# Patient Record
Sex: Male | Born: 1970 | Race: White | Hispanic: No | Marital: Married | Smoking: Never smoker
Health system: Southern US, Community
[De-identification: ages and names within clinical notes are randomized; demographics above are authoritative.]

## PROBLEM LIST (undated history)

## (undated) DIAGNOSIS — J189 Pneumonia, unspecified organism: Secondary | ICD-10-CM

## (undated) DIAGNOSIS — K219 Gastro-esophageal reflux disease without esophagitis: Secondary | ICD-10-CM

## (undated) DIAGNOSIS — I4891 Unspecified atrial fibrillation: Secondary | ICD-10-CM

## (undated) DIAGNOSIS — R7303 Prediabetes: Secondary | ICD-10-CM

## (undated) DIAGNOSIS — M1990 Unspecified osteoarthritis, unspecified site: Secondary | ICD-10-CM

## (undated) DIAGNOSIS — Z973 Presence of spectacles and contact lenses: Secondary | ICD-10-CM

## (undated) DIAGNOSIS — C801 Malignant (primary) neoplasm, unspecified: Secondary | ICD-10-CM

## (undated) DIAGNOSIS — I10 Essential (primary) hypertension: Secondary | ICD-10-CM

## (undated) HISTORY — PX: ESOPHAGOGASTRODUODENOSCOPY (EGD) WITH ESOPHAGEAL DILATION: SHX5812

## (undated) HISTORY — PX: KNEE ARTHROSCOPY: SUR90

## (undated) HISTORY — PX: VASECTOMY: SHX75

---

## 2012-05-15 ENCOUNTER — Emergency Department: Payer: Self-pay | Admitting: Emergency Medicine

## 2017-03-11 ENCOUNTER — Other Ambulatory Visit: Payer: Self-pay | Admitting: Sports Medicine

## 2017-03-11 DIAGNOSIS — M25541 Pain in joints of right hand: Secondary | ICD-10-CM

## 2017-03-11 DIAGNOSIS — M67441 Ganglion, right hand: Secondary | ICD-10-CM

## 2017-03-11 DIAGNOSIS — M25841 Other specified joint disorders, right hand: Secondary | ICD-10-CM

## 2017-03-12 ENCOUNTER — Other Ambulatory Visit: Payer: Self-pay | Admitting: Sports Medicine

## 2017-03-18 ENCOUNTER — Ambulatory Visit
Admission: RE | Admit: 2017-03-18 | Discharge: 2017-03-18 | Disposition: A | Discharge status: Home / Self Care | Payer: 59 | Source: Ambulatory Visit | Attending: Sports Medicine | Admitting: Sports Medicine

## 2017-03-18 DIAGNOSIS — M25841 Other specified joint disorders, right hand: Secondary | ICD-10-CM

## 2017-03-18 DIAGNOSIS — M67441 Ganglion, right hand: Secondary | ICD-10-CM | POA: Diagnosis not present

## 2017-03-18 DIAGNOSIS — M67843 Other specified disorders of tendon, right hand: Secondary | ICD-10-CM | POA: Insufficient documentation

## 2017-03-18 DIAGNOSIS — M25541 Pain in joints of right hand: Secondary | ICD-10-CM | POA: Diagnosis not present

## 2017-03-18 MED ORDER — GADOBENATE DIMEGLUMINE 529 MG/ML IV SOLN
20.0000 mL | Freq: Once | INTRAVENOUS | Status: AC | PRN
  Administered 2017-03-18: 20 mL via INTRAVENOUS

## 2017-04-21 ENCOUNTER — Encounter: Payer: Self-pay | Admitting: *Deleted

## 2017-04-21 ENCOUNTER — Other Ambulatory Visit: Payer: Self-pay

## 2017-04-28 ENCOUNTER — Ambulatory Visit: Payer: 59 | Admitting: Anesthesiology

## 2017-04-28 ENCOUNTER — Encounter
Admission: RE | Disposition: A | Discharge status: Home / Self Care | Payer: Self-pay | Source: Ambulatory Visit | Attending: Surgery

## 2017-04-28 ENCOUNTER — Ambulatory Visit
Admission: RE | Admit: 2017-04-28 | Discharge: 2017-04-28 | Disposition: A | Discharge status: Home / Self Care | Payer: 59 | Source: Ambulatory Visit | Attending: Surgery | Admitting: Surgery

## 2017-04-28 DIAGNOSIS — M1990 Unspecified osteoarthritis, unspecified site: Secondary | ICD-10-CM | POA: Diagnosis not present

## 2017-04-28 DIAGNOSIS — D481 Neoplasm of uncertain behavior of connective and other soft tissue: Secondary | ICD-10-CM | POA: Insufficient documentation

## 2017-04-28 DIAGNOSIS — E669 Obesity, unspecified: Secondary | ICD-10-CM | POA: Diagnosis not present

## 2017-04-28 DIAGNOSIS — Z6831 Body mass index (BMI) 31.0-31.9, adult: Secondary | ICD-10-CM | POA: Insufficient documentation

## 2017-04-28 DIAGNOSIS — I10 Essential (primary) hypertension: Secondary | ICD-10-CM | POA: Insufficient documentation

## 2017-04-28 DIAGNOSIS — R0683 Snoring: Secondary | ICD-10-CM | POA: Diagnosis not present

## 2017-04-28 DIAGNOSIS — K219 Gastro-esophageal reflux disease without esophagitis: Secondary | ICD-10-CM | POA: Insufficient documentation

## 2017-04-28 HISTORY — DX: Essential (primary) hypertension: I10

## 2017-04-28 HISTORY — DX: Gastro-esophageal reflux disease without esophagitis: K21.9

## 2017-04-28 HISTORY — DX: Presence of spectacles and contact lenses: Z97.3

## 2017-04-28 HISTORY — PX: MASS EXCISION: SHX2000

## 2017-04-28 HISTORY — DX: Unspecified osteoarthritis, unspecified site: M19.90

## 2017-04-28 SURGERY — EXCISION MASS
Anesthesia: General | Laterality: Right | Wound class: Clean

## 2017-04-28 MED ORDER — GLYCOPYRROLATE 0.2 MG/ML IJ SOLN
INTRAMUSCULAR | Status: DC | PRN
  Administered 2017-04-28: 0.1 mg via INTRAVENOUS

## 2017-04-28 MED ORDER — LACTATED RINGERS IV SOLN
INTRAVENOUS | Status: DC
  Administered 2017-04-28: 12:00:00 via INTRAVENOUS

## 2017-04-28 MED ORDER — FENTANYL CITRATE (PF) 100 MCG/2ML IJ SOLN: 25.0000 ug | INTRAMUSCULAR | Status: DC | PRN

## 2017-04-28 MED ORDER — ACETAMINOPHEN 10 MG/ML IV SOLN: 1000.0000 mg | Freq: Once | INTRAVENOUS | Status: DC | PRN

## 2017-04-28 MED ORDER — DEXAMETHASONE SODIUM PHOSPHATE 4 MG/ML IJ SOLN
INTRAMUSCULAR | Status: DC | PRN
  Administered 2017-04-28: 4 mg via INTRAVENOUS

## 2017-04-28 MED ORDER — ONDANSETRON HCL 4 MG/2ML IJ SOLN
INTRAMUSCULAR | Status: DC | PRN
  Administered 2017-04-28: 4 mg via INTRAVENOUS

## 2017-04-28 MED ORDER — OXYCODONE HCL 5 MG PO TABS: 5.0000 mg | ORAL_TABLET | Freq: Once | ORAL | Status: DC | PRN

## 2017-04-28 MED ORDER — ONDANSETRON HCL 4 MG PO TABS: 4.0000 mg | ORAL_TABLET | Freq: Four times a day (QID) | ORAL | Status: DC | PRN

## 2017-04-28 MED ORDER — DEXTROSE 5 % IV SOLN
2000.0000 mg | Freq: Once | INTRAVENOUS | Status: AC
  Administered 2017-04-28: 2000 mg via INTRAVENOUS

## 2017-04-28 MED ORDER — HYDROCODONE-ACETAMINOPHEN 5-325 MG PO TABS: 1.0000 | ORAL_TABLET | ORAL | Status: DC | PRN

## 2017-04-28 MED ORDER — HYDROCODONE-ACETAMINOPHEN 5-325 MG PO TABS: 1.0000 | ORAL_TABLET | Freq: Four times a day (QID) | ORAL | 0 refills | Status: DC | PRN

## 2017-04-28 MED ORDER — ONDANSETRON HCL 4 MG/2ML IJ SOLN: 4.0000 mg | Freq: Once | INTRAMUSCULAR | Status: DC | PRN

## 2017-04-28 MED ORDER — PROPOFOL 10 MG/ML IV BOLUS
INTRAVENOUS | Status: DC | PRN
  Administered 2017-04-28: 150 mg via INTRAVENOUS

## 2017-04-28 MED ORDER — ONDANSETRON HCL 4 MG/2ML IJ SOLN: 4.0000 mg | Freq: Four times a day (QID) | INTRAMUSCULAR | Status: DC | PRN

## 2017-04-28 MED ORDER — BUPIVACAINE HCL (PF) 0.5 % IJ SOLN
INTRAMUSCULAR | Status: DC | PRN
  Administered 2017-04-28: 10 mL via INTRA_ARTICULAR

## 2017-04-28 MED ORDER — FENTANYL CITRATE (PF) 100 MCG/2ML IJ SOLN
INTRAMUSCULAR | Status: DC | PRN
  Administered 2017-04-28: 50 ug via INTRAVENOUS

## 2017-04-28 MED ORDER — LIDOCAINE HCL (CARDIAC) 20 MG/ML IV SOLN
INTRAVENOUS | Status: DC | PRN
  Administered 2017-04-28: 40 mg via INTRATRACHEAL

## 2017-04-28 MED ORDER — OXYCODONE HCL 5 MG/5ML PO SOLN: 5.0000 mg | Freq: Once | ORAL | Status: DC | PRN

## 2017-04-28 MED ORDER — METOCLOPRAMIDE HCL 5 MG PO TABS: 5.0000 mg | ORAL_TABLET | Freq: Three times a day (TID) | ORAL | Status: DC | PRN

## 2017-04-28 MED ORDER — ACETAMINOPHEN 325 MG PO TABS: 650.0000 mg | ORAL_TABLET | Freq: Once | ORAL | Status: DC | PRN

## 2017-04-28 MED ORDER — METOCLOPRAMIDE HCL 5 MG/ML IJ SOLN: 5.0000 mg | Freq: Three times a day (TID) | INTRAMUSCULAR | Status: DC | PRN

## 2017-04-28 MED ORDER — LACTATED RINGERS IV SOLN: INTRAVENOUS | Status: DC

## 2017-04-28 MED ORDER — POTASSIUM CHLORIDE IN NACL 20-0.9 MEQ/L-% IV SOLN: INTRAVENOUS | Status: DC

## 2017-04-28 MED ORDER — ACETAMINOPHEN 160 MG/5ML PO SOLN: 325.0000 mg | ORAL | Status: DC | PRN

## 2017-04-28 MED ORDER — MIDAZOLAM HCL 5 MG/5ML IJ SOLN
INTRAMUSCULAR | Status: DC | PRN
  Administered 2017-04-28: 2 mg via INTRAVENOUS

## 2017-04-28 SURGICAL SUPPLY — 26 items
BANDAGE ELASTIC 2 LF NS (GAUZE/BANDAGES/DRESSINGS) ×3 IMPLANT
BNDG COHESIVE 4X5 TAN STRL (GAUZE/BANDAGES/DRESSINGS) ×3 IMPLANT
BNDG ESMARK 4X12 TAN STRL LF (GAUZE/BANDAGES/DRESSINGS) ×3 IMPLANT
CANISTER SUCT 1200ML W/VALVE (MISCELLANEOUS) ×3 IMPLANT
CHLORAPREP W/TINT 26ML (MISCELLANEOUS) ×3 IMPLANT
CORD BIP STRL DISP 12FT (MISCELLANEOUS) ×3 IMPLANT
COVER LIGHT HANDLE UNIVERSAL (MISCELLANEOUS) ×6 IMPLANT
CUFF TOURN SGL QUICK 24 (TOURNIQUET CUFF) ×2
CUFF TRNQT CYL 24X4X40X1 (TOURNIQUET CUFF) ×1 IMPLANT
GAUZE PETRO XEROFOAM 1X8 (MISCELLANEOUS) ×3 IMPLANT
GAUZE SPONGE 4X4 12PLY STRL (GAUZE/BANDAGES/DRESSINGS) ×3 IMPLANT
GLOVE BIO SURGEON STRL SZ8 (GLOVE) ×3 IMPLANT
GLOVE INDICATOR 8.0 STRL GRN (GLOVE) ×3 IMPLANT
GOWN STRL REUS W/ TWL LRG LVL3 (GOWN DISPOSABLE) ×1 IMPLANT
GOWN STRL REUS W/ TWL XL LVL3 (GOWN DISPOSABLE) ×1 IMPLANT
GOWN STRL REUS W/TWL LRG LVL3 (GOWN DISPOSABLE) ×2
GOWN STRL REUS W/TWL XL LVL3 (GOWN DISPOSABLE) ×2
KIT ROOM TURNOVER OR (KITS) ×3 IMPLANT
NS IRRIG 500ML POUR BTL (IV SOLUTION) ×3 IMPLANT
PACK EXTREMITY ARMC (MISCELLANEOUS) ×3 IMPLANT
PAD GROUND ADULT SPLIT (MISCELLANEOUS) ×3 IMPLANT
STOCKINETTE IMPERVIOUS LG (DRAPES) ×3 IMPLANT
STRAP BODY AND KNEE 60X3 (MISCELLANEOUS) ×3 IMPLANT
SUT PROLENE 4 0 PS 2 18 (SUTURE) ×3 IMPLANT
SUT VIC AB 3-0 SH 27 (SUTURE) ×2
SUT VIC AB 3-0 SH 27X BRD (SUTURE) ×1 IMPLANT

## 2017-04-28 NOTE — H&P (Signed)
Paper H&P to be scanned into permanent record. H&P reviewed and patient re-examined. No changes. 

## 2017-04-28 NOTE — Anesthesia Preprocedure Evaluation (Signed)
Anesthesia Evaluation  Patient identified by MRN, date of birth, ID band Patient awake    History of Anesthesia Complications Negative for: history of anesthetic complications  Airway Mallampati: I  TM Distance: >3 FB Neck ROM: Full    Dental no notable dental hx.    Pulmonary  Snoring    Pulmonary exam normal breath sounds clear to auscultation       Cardiovascular Exercise Tolerance: Good hypertension, Normal cardiovascular exam Rhythm:Regular Rate:Normal     Neuro/Psych negative neurological ROS     GI/Hepatic GERD  ,  Endo/Other  negative endocrine ROS  Renal/GU negative Renal ROS     Musculoskeletal  (+) Arthritis , Osteoarthritis,    Abdominal   Peds  Hematology negative hematology ROS (+)   Anesthesia Other Findings   Reproductive/Obstetrics                             Anesthesia Physical Anesthesia Plan  ASA: II  Anesthesia Plan: General   Post-op Pain Management:    Induction: Intravenous  PONV Risk Score and Plan: 2 and Ondansetron and Dexamethasone  Airway Management Planned: LMA  Additional Equipment:   Intra-op Plan:   Post-operative Plan: Extubation in OR  Informed Consent: I have reviewed the patients History and Physical, chart, labs and discussed the procedure including the risks, benefits and alternatives for the proposed anesthesia with the patient or authorized representative who has indicated his/her understanding and acceptance.     Plan Discussed with: CRNA  Anesthesia Plan Comments:         Anesthesia Quick Evaluation

## 2017-04-28 NOTE — Discharge Instructions (Signed)
General Anesthesia, Adult, Care After These instructions provide you with information about caring for yourself after your procedure. Your health care provider may also give you more specific instructions. Your treatment has been planned according to current medical practices, but problems sometimes occur. Call your health care provider if you have any problems or questions after your procedure. What can I expect after the procedure? After the procedure, it is common to have:  Vomiting.  A sore throat.  Mental slowness.  It is common to feel:  Nauseous.  Cold or shivery.  Sleepy.  Tired.  Sore or achy, even in parts of your body where you did not have surgery.  Follow these instructions at home: For at least 24 hours after the procedure:  Do not: ? Participate in activities where you could fall or become injured. ? Drive. ? Use heavy machinery. ? Drink alcohol. ? Take sleeping pills or medicines that cause drowsiness. ? Make important decisions or sign legal documents. ? Take care of children on your own.  Rest. Eating and drinking  If you vomit, drink water, juice, or soup when you can drink without vomiting.  Drink enough fluid to keep your urine clear or pale yellow.  Make sure you have little or no nausea before eating solid foods.  Follow the diet recommended by your health care provider. General instructions  Have a responsible adult stay with you until you are awake and alert.  Return to your normal activities as told by your health care provider. Ask your health care provider what activities are safe for you.  Take over-the-counter and prescription medicines only as told by your health care provider.  If you smoke, do not smoke without supervision.  Keep all follow-up visits as told by your health care provider. This is important. Contact a health care provider if:  You continue to have nausea or vomiting at home, and medicines are not helpful.  You  cannot drink fluids or start eating again.  You cannot urinate after 8-12 hours.  You develop a skin rash.  You have fever.  You have increasing redness at the site of your procedure. Get help right away if:  You have difficulty breathing.  You have chest pain.  You have unexpected bleeding.  You feel that you are having a life-threatening or urgent problem. This information is not intended to replace advice given to you by your health care provider. Make sure you discuss any questions you have with your health care provider. Document Released: 08/10/2000 Document Revised: 10/07/2015 Document Reviewed: 04/18/2015 Elsevier Interactive Patient Education  2018 [employer].   Keep dressing dry and intact. Keep hand elevated above heart level. May shower after dressing removed on postop day 4 (Sunday). Cover sutures with Band-Aids after drying off. Apply ice to affected area frequently. Take ibuprofen 600-800 mg TID with meals for 7-10 days, then as necessary. Take ES Tylenol or pain medication as prescribed when needed.  Return for follow-up in 10-14 days or as scheduled.

## 2017-04-28 NOTE — Anesthesia Postprocedure Evaluation (Signed)
Anesthesia Post Note  Patient: Mason West  Procedure(s) Performed: EXCISION THUMB (Right )  Patient location during evaluation: PACU Anesthesia Type: General Level of consciousness: awake and alert, oriented and patient cooperative Pain management: pain level controlled Vital Signs Assessment: post-procedure vital signs reviewed and stable Respiratory status: spontaneous breathing, nonlabored ventilation and respiratory function stable Cardiovascular status: blood pressure returned to baseline and stable Postop Assessment: adequate PO intake Anesthetic complications: no    Darrin Nipper

## 2017-04-28 NOTE — Anesthesia Procedure Notes (Signed)
Procedure Name: LMA Insertion Date/Time: 04/28/2017 12:17 PM Performed by: Mayme Genta, CRNA Pre-anesthesia Checklist: Patient identified, Emergency Drugs available, Suction available, Timeout performed and Patient being monitored Patient Re-evaluated:Patient Re-evaluated prior to induction Oxygen Delivery Method: Circle system utilized Preoxygenation: Pre-oxygenation with 100% oxygen Induction Type: IV induction LMA: LMA inserted LMA Size: 4.0 Number of attempts: 1 Placement Confirmation: positive ETCO2 and breath sounds checked- equal and bilateral Tube secured with: Tape

## 2017-04-28 NOTE — Transfer of Care (Signed)
Immediate Anesthesia Transfer of Care Note  Patient: Mason West  Procedure(s) Performed: EXCISION THUMB (Right )  Patient Location: PACU  Anesthesia Type: General  Level of Consciousness: awake, alert  and patient cooperative  Airway and Oxygen Therapy: Patient Spontanous Breathing and Patient connected to supplemental oxygen  Post-op Assessment: Post-op Vital signs reviewed, Patient's Cardiovascular Status Stable, Respiratory Function Stable, Patent Airway and No signs of Nausea or vomiting  Post-op Vital Signs: Reviewed and stable  Complications: No apparent anesthesia complications

## 2017-04-28 NOTE — Op Note (Signed)
04/28/2017  2:56 PM  Patient:   Mason West  Pre-Op Diagnosis:   Soft tissue mass, right thumb.  Post-Op Diagnosis:   Same.  Procedure:   Excision of soft tissue mass, right thumb.  Surgeon:   Pascal Lux, MD  Assistant:   None  Anesthesia:   General LMA  Findings:   As above. 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  Complications:   None  Fluids:   850 cc crystalloid  EBL:   0 cc  TT:   60 minutes at 250 mmHg  Drains:   None  Closure:   4-0 Prolene interrupted sutures  Brief Clinical Note:   The patient is a 46 year old male with a several month history of a gradually enlarging soft tissue mass along his left thumb proximal to the IP joint but crossing the MCP joint. Initial evaluation by ultrasound suggested that it was a solid tumor. Subsequent MR imaging confirmed the presence of a benign-appearing soft tissue mass along the ulnar aspect of his right thumb. The patient presents at this time for excision of the mass.  Procedure:   The patient was brought into the operating room and laid in the supine position. After adequate general laryngeal mask anesthesia was obtained, the right hand and upper extremity were prepped with ChloraPrep solution before being draped sterilely. Preoperative antibiotics were administered. A timeout was formed to verify the appropriate surgical site before the limb was exsanguinated with an Esmarch and the tourniquet inflated to 250 mmHg. An approximately 3-4 cm incision was made along the ulnar aspect of the thumb centered over the mass. The incision was carried down through the subcutaneous tissues to expose the mass. Using careful dissection, the dorsal digital nerves were elevated dorsally, while care was taken to avoid the ulnar neurovascular bundle volarly. The mass was somewhat lobular and yellowish in color. It extended along the ulnar aspect of the thumb from just proximal to the IP joint ulnar and  proximal to the MCP joint. It also extended deep to the adductor hood so a portion of this was released in order to completely excise the mass. The mass was then sent off to pathology for definitive identification.  The wound was copiously irrigated with sterile saline solution before the adductor hood was reapproximated using 3-0 Vicryl interrupted sutures. The skin was closed using 4-0 Prolene interrupted sutures. A total of 10 cc of 0.5% plain Sensorcaine was injected in and around the incision site for postoperative analgesia before a sterile bulky dressing was applied to the thumb. The patient was then awakened, extubated, and returned to the recovery room in satisfactory condition after tolerating the procedure well.

## 2017-04-29 ENCOUNTER — Encounter: Payer: Self-pay | Admitting: Surgery

## 2017-04-30 LAB — SURGICAL PATHOLOGY
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
## 2022-03-17 ENCOUNTER — Other Ambulatory Visit: Payer: Self-pay | Admitting: Surgery

## 2022-03-17 DIAGNOSIS — S46011A Strain of muscle(s) and tendon(s) of the rotator cuff of right shoulder, initial encounter: Secondary | ICD-10-CM

## 2022-04-07 ENCOUNTER — Ambulatory Visit
Admission: RE | Admit: 2022-04-07 | Discharge: 2022-04-07 | Disposition: A | Discharge status: Home / Self Care | Payer: 59 | Source: Ambulatory Visit | Attending: Surgery | Admitting: Surgery

## 2022-04-07 DIAGNOSIS — S46011A Strain of muscle(s) and tendon(s) of the rotator cuff of right shoulder, initial encounter: Secondary | ICD-10-CM

## 2022-05-13 ENCOUNTER — Other Ambulatory Visit: Payer: Self-pay | Admitting: Surgery

## 2022-05-25 ENCOUNTER — Other Ambulatory Visit: Payer: Self-pay

## 2022-05-25 ENCOUNTER — Encounter
Admission: RE | Admit: 2022-05-25 | Discharge: 2022-05-25 | Disposition: A | Discharge status: Home / Self Care | Payer: 59 | Source: Ambulatory Visit | Attending: Surgery | Admitting: Surgery

## 2022-05-25 DIAGNOSIS — Z01812 Encounter for preprocedural laboratory examination: Secondary | ICD-10-CM

## 2022-05-25 HISTORY — DX: Malignant (primary) neoplasm, unspecified: C80.1

## 2022-05-25 HISTORY — DX: Prediabetes: R73.03

## 2022-05-25 HISTORY — DX: Unspecified atrial fibrillation: I48.91

## 2022-05-25 HISTORY — DX: Pneumonia, unspecified organism: J18.9

## 2022-05-25 NOTE — Patient Instructions (Addendum)
Your procedure is scheduled on: 06/02/22 - Tuesday Report to the Registration Desk on the 1st floor of the [facility]. To find out your arrival time, please call [phone] between 1PM - 3PM on: 06/01/22 - Monday If your arrival time is 6:00 am, do not arrive prior to that time as the [facility] entrance doors do not open until 6:00 am.  REMEMBER: Instructions that are not followed completely may result in serious medical risk, up to and including death; or upon the discretion of your surgeon and anesthesiologist your surgery may need to be rescheduled.  Do not eat food after midnight the night before surgery.  No gum chewing, lozengers or hard candies.  You may however, drink CLEAR liquids up to 2 hours before you are scheduled to arrive for your surgery. Do not drink anything within 2 hours of your scheduled arrival time.  Clear liquids include: - water  - apple juice without pulp - gatorade (not RED colors) - black coffee or tea (Do NOT add milk or creamers to the coffee or tea) Do NOT drink anything that is not on this list.  In addition, your doctor has ordered for you to drink the provided  Ensure Pre-Surgery Clear Carbohydrate Drink  Drinking this carbohydrate drink up to two hours before surgery helps to reduce insulin resistance and improve patient outcomes. Please complete drinking 2 hours prior to scheduled arrival time.  TAKE THESE MEDICATIONS THE MORNING OF SURGERY WITH A SIP OF WATER:  - amLODipine (NORVASC)  - omeprazole (PRILOSEC )- (take one the night before and one on the morning of surgery - helps to prevent nausea after surgery.)   One week prior to surgery: Stop Anti-inflammatories (NSAIDS) such as Advil, Aleve, Ibuprofen, Motrin, Naproxen, Naprosyn and Aspirin based products such as Excedrin, Goodys Powder, BC Powder.  Stop ANY OVER THE COUNTER supplements until after surgery.  You may however, continue to take Tylenol if needed for pain up until the  day of surgery.  No Alcohol for 24 hours before or after surgery.  No Smoking including e-cigarettes for 24 hours prior to surgery.  No chewable tobacco products for at least 6 hours prior to surgery.  No nicotine patches on the day of surgery.  Do not use any "recreational" drugs for at least a week prior to your surgery.  Please be advised that the combination of cocaine and anesthesia may have negative outcomes, up to and including death. If you test positive for cocaine, your surgery will be cancelled.  On the morning of surgery brush your teeth with toothpaste and water, you may rinse your mouth with mouthwash if you wish.Do not swallow any toothpaste or mouthwash.  Use CHG Soap or wipes as directed on instruction sheet.  Do not wear jewelry, make-up, hairpins, clips or nail polish.  Do not wear lotions, powders, or perfumes.   Do not shave body from the neck down 48 hours prior to surgery just in case you cut yourself which could leave a site for infection. Also, freshly shaved skin may become irritated if using the CHG soap.  Contact lenses, hearing aids and dentures may not be worn into surgery.  Do not bring valuables to the hospital. [facility] is not responsible for any missing/lost belongings or valuables.   Notify your doctor if there is any change in your medical condition (cold, fever, infection).  Wear comfortable clothing (specific to your surgery type) to the hospital.  After surgery, you can help prevent lung  complications by doing breathing exercises.  Take deep breaths and cough every 1-2 hours. Your doctor may order a device called an Incentive Spirometer to help you take deep breaths. When coughing or sneezing, hold a pillow firmly against your incision with both hands. This is called "splinting." Doing this helps protect your incision. It also decreases belly discomfort.  If you are being admitted to the hospital overnight, leave your suitcase in the  car. After surgery it may be brought to your room.  If you are being discharged the day of surgery, you will not be allowed to drive home. You will need a responsible adult (18 years or older) to drive you home and stay with you that night.   If you are taking public transportation, you will need to have a responsible adult (18 years or older) with you. Please confirm with your physician that it is acceptable to use public transportation.   Please call the [facility] at [phone] if you have any questions about these instructions.  Surgery Visitation Policy:  Patients undergoing a surgery or procedure may have two family members or support persons with them as long as the person is not COVID-19 positive or experiencing its symptoms.   Inpatient Visitation:    Visiting hours are 7 a.m. to 8 p.m. Up to four visitors are allowed at one time in a patient room. The visitors may rotate out with other people during the day. One designated support person (adult) may remain overnight.  Due to an increase in RSV and influenza rates and associated hospitalizations, children ages 45 and under will not be able to visit patients in [facility]. Masks continue to be strongly recommended.

## 2022-05-26 ENCOUNTER — Encounter
Admission: RE | Admit: 2022-05-26 | Discharge: 2022-05-26 | Disposition: A | Discharge status: Home / Self Care | Payer: 59 | Source: Ambulatory Visit | Attending: Surgery | Admitting: Surgery

## 2022-05-26 DIAGNOSIS — Z01818 Encounter for other preprocedural examination: Secondary | ICD-10-CM | POA: Insufficient documentation

## 2022-05-26 DIAGNOSIS — Z01812 Encounter for preprocedural laboratory examination: Secondary | ICD-10-CM

## 2022-05-26 LAB — CBC
HCT: 44.3 % (ref 39.0–52.0)
Hemoglobin: 14.7 g/dL (ref 13.0–17.0)
MCH: 29.7 pg (ref 26.0–34.0)
MCHC: 33.2 g/dL (ref 30.0–36.0)
MCV: 89.5 fL (ref 80.0–100.0)
Platelets: 298 10*3/uL (ref 150–400)
RBC: 4.95 MIL/uL (ref 4.22–5.81)
RDW: 12.3 % (ref 11.5–15.5)
WBC: 8.8 10*3/uL (ref 4.0–10.5)
nRBC: 0 % (ref 0.0–0.2)

## 2022-06-01 MED ORDER — CEFAZOLIN SODIUM-DEXTROSE 2-4 GM/100ML-% IV SOLN
2.0000 g | INTRAVENOUS | Status: AC
  Administered 2022-06-02: 2 g via INTRAVENOUS

## 2022-06-01 MED ORDER — CHLORHEXIDINE GLUCONATE 0.12 % MT SOLN: 15.0000 mL | Freq: Once | OROMUCOSAL | Status: AC

## 2022-06-01 MED ORDER — ORAL CARE MOUTH RINSE: 15.0000 mL | Freq: Once | OROMUCOSAL | Status: AC

## 2022-06-01 MED ORDER — LACTATED RINGERS IV SOLN: INTRAVENOUS | Status: DC

## 2022-06-02 ENCOUNTER — Ambulatory Visit
Admission: RE | Admit: 2022-06-02 | Discharge: 2022-06-02 | Disposition: A | Discharge status: Home / Self Care | Payer: 59 | Source: Ambulatory Visit | Attending: Surgery | Admitting: Surgery

## 2022-06-02 ENCOUNTER — Encounter
Admission: RE | Disposition: A | Discharge status: Home / Self Care | Payer: Self-pay | Source: Ambulatory Visit | Attending: Surgery

## 2022-06-02 ENCOUNTER — Other Ambulatory Visit: Payer: Self-pay

## 2022-06-02 ENCOUNTER — Ambulatory Visit: Payer: 59

## 2022-06-02 ENCOUNTER — Ambulatory Visit: Payer: 59 | Admitting: Urgent Care

## 2022-06-02 ENCOUNTER — Encounter: Payer: Self-pay | Admitting: Surgery

## 2022-06-02 DIAGNOSIS — S46011A Strain of muscle(s) and tendon(s) of the rotator cuff of right shoulder, initial encounter: Secondary | ICD-10-CM | POA: Insufficient documentation

## 2022-06-02 DIAGNOSIS — K219 Gastro-esophageal reflux disease without esophagitis: Secondary | ICD-10-CM | POA: Diagnosis not present

## 2022-06-02 DIAGNOSIS — W182XXA Fall in (into) shower or empty bathtub, initial encounter: Secondary | ICD-10-CM | POA: Insufficient documentation

## 2022-06-02 DIAGNOSIS — I10 Essential (primary) hypertension: Secondary | ICD-10-CM | POA: Diagnosis not present

## 2022-06-02 DIAGNOSIS — M25811 Other specified joint disorders, right shoulder: Secondary | ICD-10-CM | POA: Insufficient documentation

## 2022-06-02 HISTORY — PX: SHOULDER ARTHROSCOPY WITH SUBACROMIAL DECOMPRESSION, ROTATOR CUFF REPAIR AND BICEP TENDON REPAIR: SHX5687

## 2022-06-02 SURGERY — SHOULDER ARTHROSCOPY WITH SUBACROMIAL DECOMPRESSION, ROTATOR CUFF REPAIR AND BICEP TENDON REPAIR
Anesthesia: General | Site: Shoulder | Laterality: Right

## 2022-06-02 MED ORDER — LIDOCAINE HCL (PF) 2 % IJ SOLN
INTRAMUSCULAR | Status: AC
  Filled 2022-06-02: qty 5

## 2022-06-02 MED ORDER — METOCLOPRAMIDE HCL 10 MG PO TABS: 5.0000 mg | ORAL_TABLET | Freq: Three times a day (TID) | ORAL | Status: DC | PRN

## 2022-06-02 MED ORDER — FENTANYL CITRATE (PF) 100 MCG/2ML IJ SOLN
INTRAMUSCULAR | Status: DC | PRN
  Administered 2022-06-02: 50 ug via INTRAVENOUS
  Administered 2022-06-02 (×2): 25 ug via INTRAVENOUS

## 2022-06-02 MED ORDER — ONDANSETRON HCL 4 MG/2ML IJ SOLN
INTRAMUSCULAR | Status: DC | PRN
  Administered 2022-06-02: 4 mg via INTRAVENOUS

## 2022-06-02 MED ORDER — CEFAZOLIN SODIUM-DEXTROSE 2-4 GM/100ML-% IV SOLN
INTRAVENOUS | Status: AC
  Filled 2022-06-02: qty 100

## 2022-06-02 MED ORDER — SODIUM CHLORIDE 0.9 % IV SOLN: INTRAVENOUS | Status: DC

## 2022-06-02 MED ORDER — EPINEPHRINE PF 1 MG/ML IJ SOLN
INTRAMUSCULAR | Status: AC
  Filled 2022-06-02: qty 1

## 2022-06-02 MED ORDER — BUPIVACAINE-EPINEPHRINE 0.5% -1:200000 IJ SOLN
INTRAMUSCULAR | Status: DC | PRN
  Administered 2022-06-02: 30 mL

## 2022-06-02 MED ORDER — FENTANYL CITRATE (PF) 100 MCG/2ML IJ SOLN: 25.0000 ug | INTRAMUSCULAR | Status: DC | PRN

## 2022-06-02 MED ORDER — LIDOCAINE HCL (CARDIAC) PF 100 MG/5ML IV SOSY
PREFILLED_SYRINGE | INTRAVENOUS | Status: DC | PRN
  Administered 2022-06-02: 100 mg via INTRAVENOUS

## 2022-06-02 MED ORDER — ONDANSETRON HCL 4 MG/2ML IJ SOLN
INTRAMUSCULAR | Status: AC
  Filled 2022-06-02: qty 2

## 2022-06-02 MED ORDER — KETOROLAC TROMETHAMINE 30 MG/ML IJ SOLN
30.0000 mg | Freq: Once | INTRAMUSCULAR | Status: AC
  Administered 2022-06-02: 30 mg via INTRAVENOUS

## 2022-06-02 MED ORDER — BUPIVACAINE HCL (PF) 0.5 % IJ SOLN
INTRAMUSCULAR | Status: AC
  Filled 2022-06-02: qty 10

## 2022-06-02 MED ORDER — ROCURONIUM BROMIDE 10 MG/ML (PF) SYRINGE
PREFILLED_SYRINGE | INTRAVENOUS | Status: AC
  Filled 2022-06-02: qty 10

## 2022-06-02 MED ORDER — PROPOFOL 10 MG/ML IV BOLUS
INTRAVENOUS | Status: AC
  Filled 2022-06-02: qty 20

## 2022-06-02 MED ORDER — BUPIVACAINE HCL (PF) 0.5 % IJ SOLN
INTRAMUSCULAR | Status: DC | PRN
  Administered 2022-06-02: 10 mL via PERINEURAL

## 2022-06-02 MED ORDER — FENTANYL CITRATE PF 50 MCG/ML IJ SOSY
PREFILLED_SYRINGE | INTRAMUSCULAR | Status: AC
  Administered 2022-06-02: 50 ug via INTRAVENOUS
  Filled 2022-06-02: qty 1

## 2022-06-02 MED ORDER — DEXAMETHASONE SODIUM PHOSPHATE 10 MG/ML IJ SOLN
INTRAMUSCULAR | Status: AC
  Filled 2022-06-02: qty 1

## 2022-06-02 MED ORDER — PROPOFOL 10 MG/ML IV BOLUS
INTRAVENOUS | Status: DC | PRN
  Administered 2022-06-02: 200 mg via INTRAVENOUS

## 2022-06-02 MED ORDER — ROCURONIUM BROMIDE 100 MG/10ML IV SOLN
INTRAVENOUS | Status: DC | PRN
  Administered 2022-06-02: 30 mg via INTRAVENOUS

## 2022-06-02 MED ORDER — SUCCINYLCHOLINE CHLORIDE 200 MG/10ML IV SOSY
PREFILLED_SYRINGE | INTRAVENOUS | Status: AC
  Filled 2022-06-02: qty 10

## 2022-06-02 MED ORDER — PROMETHAZINE HCL 25 MG/ML IJ SOLN: 6.2500 mg | INTRAMUSCULAR | Status: DC | PRN

## 2022-06-02 MED ORDER — FENTANYL CITRATE (PF) 100 MCG/2ML IJ SOLN
INTRAMUSCULAR | Status: AC
  Filled 2022-06-02: qty 2

## 2022-06-02 MED ORDER — MIDAZOLAM HCL 2 MG/2ML IJ SOLN
INTRAMUSCULAR | Status: AC
  Administered 2022-06-02: 1 mg via INTRAVENOUS
  Filled 2022-06-02: qty 2

## 2022-06-02 MED ORDER — PHENYLEPHRINE HCL (PRESSORS) 10 MG/ML IV SOLN
INTRAVENOUS | Status: DC | PRN
  Administered 2022-06-02: 80 ug via INTRAVENOUS

## 2022-06-02 MED ORDER — KETOROLAC TROMETHAMINE 30 MG/ML IJ SOLN
INTRAMUSCULAR | Status: AC
  Filled 2022-06-02: qty 1

## 2022-06-02 MED ORDER — OXYCODONE HCL 5 MG PO TABS: 5.0000 mg | ORAL_TABLET | ORAL | 0 refills | Status: AC | PRN

## 2022-06-02 MED ORDER — SUCCINYLCHOLINE CHLORIDE 200 MG/10ML IV SOSY
PREFILLED_SYRINGE | INTRAVENOUS | Status: DC | PRN
  Administered 2022-06-02: 140 mg via INTRAVENOUS

## 2022-06-02 MED ORDER — BUPIVACAINE-EPINEPHRINE (PF) 0.5% -1:200000 IJ SOLN
INTRAMUSCULAR | Status: AC
  Filled 2022-06-02: qty 30

## 2022-06-02 MED ORDER — CHLORHEXIDINE GLUCONATE 0.12 % MT SOLN
OROMUCOSAL | Status: AC
  Administered 2022-06-02: 15 mL via OROMUCOSAL
  Filled 2022-06-02: qty 15

## 2022-06-02 MED ORDER — LACTATED RINGERS IR SOLN
Status: DC | PRN
  Administered 2022-06-02: 3001 mL

## 2022-06-02 MED ORDER — MIDAZOLAM HCL 2 MG/2ML IJ SOLN: 1.0000 mg | Freq: Once | INTRAMUSCULAR | Status: AC

## 2022-06-02 MED ORDER — PHENYLEPHRINE HCL-NACL 20-0.9 MG/250ML-% IV SOLN
INTRAVENOUS | Status: DC | PRN
  Administered 2022-06-02: 50 ug/min via INTRAVENOUS

## 2022-06-02 MED ORDER — EPHEDRINE SULFATE (PRESSORS) 50 MG/ML IJ SOLN
INTRAMUSCULAR | Status: DC | PRN
  Administered 2022-06-02: 5 mg via INTRAVENOUS

## 2022-06-02 MED ORDER — ONDANSETRON HCL 4 MG/2ML IJ SOLN: 4.0000 mg | Freq: Four times a day (QID) | INTRAMUSCULAR | Status: DC | PRN

## 2022-06-02 MED ORDER — BUPIVACAINE LIPOSOME 1.3 % IJ SUSP
INTRAMUSCULAR | Status: AC
  Filled 2022-06-02: qty 20

## 2022-06-02 MED ORDER — LIDOCAINE HCL (PF) 1 % IJ SOLN
INTRAMUSCULAR | Status: DC | PRN
  Administered 2022-06-02: 4 mL via SUBCUTANEOUS

## 2022-06-02 MED ORDER — ONDANSETRON HCL 4 MG PO TABS: 4.0000 mg | ORAL_TABLET | Freq: Four times a day (QID) | ORAL | Status: DC | PRN

## 2022-06-02 MED ORDER — OXYCODONE HCL 5 MG PO TABS: 5.0000 mg | ORAL_TABLET | ORAL | Status: DC | PRN

## 2022-06-02 MED ORDER — LIDOCAINE HCL (PF) 1 % IJ SOLN
INTRAMUSCULAR | Status: AC
  Filled 2022-06-02: qty 5

## 2022-06-02 MED ORDER — DEXAMETHASONE SODIUM PHOSPHATE 10 MG/ML IJ SOLN
INTRAMUSCULAR | Status: DC | PRN
  Administered 2022-06-02: 5 mg via INTRAVENOUS

## 2022-06-02 MED ORDER — BUPIVACAINE LIPOSOME 1.3 % IJ SUSP
INTRAMUSCULAR | Status: DC | PRN
  Administered 2022-06-02: 20 mL via PERINEURAL

## 2022-06-02 MED ORDER — FENTANYL CITRATE PF 50 MCG/ML IJ SOSY: 50.0000 ug | PREFILLED_SYRINGE | Freq: Once | INTRAMUSCULAR | Status: AC

## 2022-06-02 MED ORDER — METOCLOPRAMIDE HCL 5 MG/ML IJ SOLN: 5.0000 mg | Freq: Three times a day (TID) | INTRAMUSCULAR | Status: DC | PRN

## 2022-06-02 SURGICAL SUPPLY — 61 items
ANCH SUT 2 2.9 2 LD TPR NDL (Anchor) ×1 IMPLANT
ANCH SUT KNTLS STRL SHLDR SYS (Anchor) ×2 IMPLANT
ANCH SUT Q-FX 2.8 (Anchor) ×4 IMPLANT
ANCHOR ALL-SUT Q-FIX 2.8 (Anchor) IMPLANT
ANCHOR JUGGERKNOT WTAP NDL 2.9 (Anchor) IMPLANT
ANCHOR SUT QUATTRO KNTLS 4.5 (Anchor) IMPLANT
APL PRP STRL LF DISP 70% ISPRP (MISCELLANEOUS) ×1
BIT DRILL JUGRKNT W/NDL BIT2.9 (DRILL) IMPLANT
BLADE FULL RADIUS 3.5 (BLADE) ×1 IMPLANT
BUR ACROMIONIZER 4.0 (BURR) ×1 IMPLANT
CANNULA SHAVER 8MMX76MM (CANNULA) ×1 IMPLANT
CHLORAPREP W/TINT 26 (MISCELLANEOUS) ×1 IMPLANT
COOLER POLAR GLACIER W/PUMP (MISCELLANEOUS) IMPLANT
COVER MAYO STAND REUSABLE (DRAPES) ×1 IMPLANT
DILATOR 5.5 THREADED HEALICOIL (MISCELLANEOUS) IMPLANT
DRILL JUGGERKNOT W/NDL BIT 2.9 (DRILL) ×1
ELECT CAUTERY BLADE 6.4 (BLADE) ×1 IMPLANT
ELECT REM PT RETURN 9FT ADLT (ELECTROSURGICAL) ×1
ELECTRODE REM PT RTRN 9FT ADLT (ELECTROSURGICAL) ×1 IMPLANT
GAUZE SPONGE 4X4 12PLY STRL (GAUZE/BANDAGES/DRESSINGS) ×1 IMPLANT
GAUZE XEROFORM 1X8 LF (GAUZE/BANDAGES/DRESSINGS) ×1 IMPLANT
GLOVE BIO SURGEON STRL SZ7.5 (GLOVE) ×2 IMPLANT
GLOVE BIO SURGEON STRL SZ8 (GLOVE) ×2 IMPLANT
GLOVE BIOGEL PI IND STRL 8 (GLOVE) ×1 IMPLANT
GLOVE SURG UNDER LTX SZ8 (GLOVE) ×1 IMPLANT
GOWN STRL REUS W/ TWL LRG LVL3 (GOWN DISPOSABLE) ×1 IMPLANT
GOWN STRL REUS W/ TWL XL LVL3 (GOWN DISPOSABLE) ×1 IMPLANT
GOWN STRL REUS W/TWL LRG LVL3 (GOWN DISPOSABLE) ×1
GOWN STRL REUS W/TWL XL LVL3 (GOWN DISPOSABLE) ×1
GRAFT TISS 40X70 3 THK DERM (Tissue) IMPLANT
GRASPER SUT 15 45D LOW PRO (SUTURE) IMPLANT
IV LACTATED RINGER IRRG 3000ML (IV SOLUTION) ×2
IV LR IRRIG 3000ML ARTHROMATIC (IV SOLUTION) ×2 IMPLANT
KIT CANNULA 8X76-LX IN CANNULA (CANNULA) ×1 IMPLANT
KIT SUTURE 2.8 Q-FIX DISP (MISCELLANEOUS) IMPLANT
MANIFOLD NEPTUNE II (INSTRUMENTS) ×2 IMPLANT
MASK FACE SPIDER DISP (MASK) ×1 IMPLANT
MAT ABSORB  FLUID 56X50 GRAY (MISCELLANEOUS) ×1
MAT ABSORB FLUID 56X50 GRAY (MISCELLANEOUS) ×1 IMPLANT
PACK ARTHROSCOPY SHOULDER (MISCELLANEOUS) ×1 IMPLANT
PAD ABD DERMACEA PRESS 5X9 (GAUZE/BANDAGES/DRESSINGS) ×2 IMPLANT
PAD WRAPON POLAR SHDR XLG (MISCELLANEOUS) IMPLANT
PASSER SUT FIRSTPASS SELF (INSTRUMENTS) IMPLANT
SLING ARM LRG DEEP (SOFTGOODS) ×1 IMPLANT
SLING ULTRA II LG (MISCELLANEOUS) ×1 IMPLANT
SPONGE T-LAP 18X18 ~~LOC~~+RFID (SPONGE) ×1 IMPLANT
STAPLER SKIN PROX 35W (STAPLE) ×1 IMPLANT
STRAP SAFETY 5IN WIDE (MISCELLANEOUS) ×1 IMPLANT
SUT ETHIBOND 0 MO6 C/R (SUTURE) ×1 IMPLANT
SUT ULTRABRAID 2 COBRAID 38 (SUTURE) IMPLANT
SUT VIC AB 2-0 CT1 27 (SUTURE) ×2
SUT VIC AB 2-0 CT1 TAPERPNT 27 (SUTURE) ×2 IMPLANT
SUT VIC AB 2-0 CT2 27 (SUTURE) IMPLANT
TAPE MICROFOAM 4IN (TAPE) ×1 IMPLANT
TISSUE ARTHOFLEX THICK 3MM (Tissue) ×1 IMPLANT
TRAP FLUID SMOKE EVACUATOR (MISCELLANEOUS) ×1 IMPLANT
TUBING CONNECTING 10 (TUBING) ×1 IMPLANT
TUBING INFLOW SET DBFLO PUMP (TUBING) ×1 IMPLANT
WAND WEREWOLF FLOW 90D (MISCELLANEOUS) ×1 IMPLANT
WATER STERILE IRR 500ML POUR (IV SOLUTION) ×1 IMPLANT
WRAPON POLAR PAD SHDR XLG (MISCELLANEOUS) ×1

## 2022-06-02 NOTE — Anesthesia Procedure Notes (Signed)
Anesthesia Regional Block: Interscalene brachial plexus block   Pre-Anesthetic Checklist: , timeout performed,  Correct Patient, Correct Site, Correct Laterality,  Correct Procedure, Correct Position, site marked,  Risks and benefits discussed,  Surgical consent,  Pre-op evaluation,  At surgeon's request and post-op pain management  Laterality: Right and Upper  Prep: chloraprep       Needles:  Injection technique: Single-shot  Needle Type: Stimiplex     Needle Length: 5cm  Needle Gauge: 22     Additional Needles:   Procedures:,,,, ultrasound used (permanent image in chart),,    Narrative:  Start time: 06/02/2022 1:42 PM End time: 06/02/2022 1:46 PM Injection made incrementally with aspirations every 5 mL.  Performed by: Personally  Anesthesiologist: Martha Clan, MD  Additional Notes: Functioning IV was confirmed and monitors were applied.  A 1m 22ga Stimuplex needle was used. Sterile prep and drape,hand hygiene and sterile gloves were used.  Negative aspiration and negative test dose prior to incremental administration of local anesthetic. The patient tolerated the procedure well.

## 2022-06-02 NOTE — Discharge Instructions (Addendum)
Orthopedic discharge instructions: Keep dressing dry and intact.  May shower after dressing changed on post-op day #4 and nerve block has worn off (Saturday).  Cover staples with Band-Aids after drying off. Apply ice frequently to shoulder or use Polar Care device. Take ibuprofen 800 mg TID with meals for 5-7 days, then as necessary. Take oxycodone as prescribed when needed.  May supplement with ES Tylenol if necessary. Keep shoulder immobilizer on at all times except may remove for bathing purposes. Follow-up in 10-14 days or as scheduled.  AMBULATORY SURGERY  DISCHARGE INSTRUCTIONS   The drugs that you were given will stay in your system until tomorrow so for the next 24 hours you should not:  Drive an automobile Make any legal decisions Drink any alcoholic beverage   You may resume regular meals tomorrow.  Today it is better to start with liquids and gradually work up to solid foods.  You may eat anything you prefer, but it is better to start with liquids, then soup and crackers, and gradually work up to solid foods.   Please notify your doctor immediately if you have any unusual bleeding, trouble breathing, redness and pain at the surgery site, drainage, fever, or pain not relieved by medication.    Additional Instructions:  PLEASE LEAVE GREEN ARMBAND ON FOR 4 DAYS   Please contact your physician with any problems or Same Day Surgery at [phone], Monday through Friday 6 am to 4 pm, or [place] at [facility] number at [phone].  POLAR CARE INFORMATION  [email or web address]  How to use Trent Cold Therapy System?  YouTube   [email or web address]  OPERATING INSTRUCTIONS  Start the product With dry hands, connect the transformer to the electrical connection located on the top of the cooler. Next, plug the transformer into an appropriate electrical outlet. The unit will automatically start running at this point.  To stop  the pump, disconnect electrical power.  Unplug to stop the product when not in use. Unplugging the Polar Care unit turns it off. Always unplug immediately after use. Never leave it plugged in while unattended. Remove pad.    FIRST ADD WATER TO FILL LINE, THEN ICE---Replace ice when existing ice is almost melted  1 Discuss Treatment with your [place] Practitioner and Use Only as Prescribed 2 Apply Insulation Barrier & Cold Therapy Pad 3 Check for Moisture 4 Inspect Skin Regularly  Tips and Trouble Shooting Usage Tips 1. Use cubed or chunked ice for optimal performance. 2. It is recommended to drain the Pad between uses. To drain the pad, hold the Pad upright with the hose pointed toward the ground. Depress the black plunger and allow water to drain out. 3. You may disconnect the Pad from the unit without removing the pad from the affected area by depressing the silver tabs on the hose coupling and gently pulling the hoses apart. The Pad and unit will seal itself and will not leak. Note: Some dripping during release is normal. 4. DO NOT RUN PUMP WITHOUT WATER! The pump in this unit is designed to run with water. Running the unit without water will cause permanent damage to the pump. 5. Unplug unit before removing lid.  TROUBLESHOOTING GUIDE Pump not running, Water not flowing to the pad, Pad is not getting cold 1. Make sure the transformer is plugged into the wall outlet. 2. Confirm that the ice and water are filled to the indicated levels. 3. Make sure there are no kinks in the pad.  4. Gently pull on the blue tube to make sure the tube/pad junction is straight. 5. Remove the pad from the treatment site and ll it while the pad is lying at; then reapply. 6. Confirm that the pad couplings are securely attached to the unit. Listen for the double clicks (Figure 1) to confirm the pad couplings are securely attached.  Leaks    Note: Some condensation on the lines, controller, and pads  is unavoidable, especially in warmer climates. 1. If using a Breg Polar Care Cold Therapy unit with a detachable Cold Therapy Pad, and a leak exists (other than condensation on the lines) disconnect the pad couplings. Make sure the silver tabs on the couplings are depressed before reconnecting the pad to the pump hose; then confirm both sides of the coupling are properly clicked in. 2. If the coupling continues to leak or a leak is detected in the pad itself, stop using it and call [facility] at [phone].  Cleaning After use, empty and dry the unit with a soft cloth. Warm water and mild detergent may be used occasionally to clean the pump and tubes.  WARNING: The [place] can be cold enough to cause serious injury, including full skin necrosis. Follow these Operating Instructions, and carefully read the Product Insert (see pouch on side of unit) and the Cold Therapy Pad Fitting Instructions (provided with each Cold Therapy Pad) prior to use.  SHOULDER SLING IMMOBILIZER   VIDEO Slingshot 2 Shoulder Brace Application - YouTube ---[email or web address]  INSTRUCTIONS While supporting the injured arm, slide the forearm into the sling. Wrap the adjustable shoulder strap around the neck and shoulders and attach the strap end to the sling using  the "alligator strap tab."  Adjust the shoulder strap to the required length. Position the shoulder pad behind the neck. To secure the shoulder pad location (optional), pull the shoulder strap away from the shoulder pad, unfold the hook material on the top of the pad, then press the shoulder strap back onto the hook material to secure the pad in place. Attach the closure strap across the open top of the sling. Position the strap so that it holds the arm securely in the sling. Next, attach the thumb strap to the open end of the sling between the thumb and fingers. After sling has been fit, it may be easily removed and  reapplied using the quick release buckle on shoulder strap. If a neutral pillow or 15 abduction pillow is included, place the pillow at the waistline. Attach the sling to the pillow, lining up hook material on the pillow with the loop on sling. Adjust the waist strap to fit.  If waist strap is too long, cut it to fit. Use the small piece of double sided hook material (located on top of the pillow) to secure the strap end. Place the double sided hook material on the inside of the cut strap end and secure it to the waist strap.     If no pillow is included, attach the waist strap to the sling and adjust to fit.    Washing Instructions: Straps and sling must be removed and cleaned regularly depending on your activity level and perspiration. Hand wash straps and sling in cold water with mild detergent, rinse, air dry        Interscalene Nerve Block with Exparel   For your surgery you have received an Interscalene Nerve Block with Exparel. Nerve Blocks affect many types of nerves, including nerves  that control movement, pain and normal sensation.  You may experience feelings such as numbness, tingling, heaviness, weakness or the inability to move your arm or the feeling or sensation that your arm has "fallen asleep". A nerve block with Exparel can last up to 5 days.  Usually the weakness wears off first.  The tingling and heaviness usually wear off next.  Finally you may start to notice pain.  Keep in mind that this may occur in any order.  Once a nerve block starts to wear off it is usually completely gone within 60 minutes. ISNB may cause mild shortness of breath, a hoarse voice, blurry vision, unequal pupils, or drooping of the face on the same side as the nerve block.  These symptoms will usually resolve with the numbness.  Very rarely the procedure itself can cause mild seizures. If needed, your surgeon will give you a prescription for pain medication.  It will take about 60 minutes for the oral  pain medication to become fully effective.  So, it is recommended that you start taking this medication before the nerve block first begins to wear off, or when you first begin to feel discomfort. Take your pain medication only as prescribed.  Pain medication can cause sedation and decrease your breathing if you take more than you need for the level of pain that you have. Nausea is a common side effect of many pain medications.  You may want to eat something before taking your pain medicine to prevent nausea. After an Interscalene nerve block, you cannot feel pain, pressure or extremes in temperature in the effected arm.  Because your arm is numb it is at an increased risk for injury.  To decrease the possibility of injury, please practice the following:  While you are awake change the position of your arm frequently to prevent too much pressure on any one area for prolonged periods of time.  If you have a cast or tight dressing, check the color or your fingers every couple of hours.  Call your surgeon with the appearance of any discoloration (white or blue). If you are given a sling to wear before you go home, please wear it  at all times until the block has completely worn off.  Do not get up at night without your sling. Please contact [facility] or your surgeon if you do not begin to regain sensation after 7 days from the surgery.  Anesthesia may be contacted by calling the Same Day Surgery Department, Mon. through Fri., 6 am to 4 pm at [phone].   If you experience any other problems or concerns, please contact your surgeon's office. If you experience severe or prolonged shortness of breath go to the nearest emergency department.

## 2022-06-02 NOTE — Anesthesia Preprocedure Evaluation (Addendum)
Anesthesia Evaluation  Patient identified by MRN, date of birth, ID band Patient awake    Reviewed: Allergy & Precautions, H&P , NPO status , Patient's Chart, lab work & pertinent test results, reviewed documented beta blocker date and time   History of Anesthesia Complications Negative for: history of anesthetic complications  Airway Mallampati: I  TM Distance: >3 FB Neck ROM: full    Dental  (+) Dental Advidsory Given, Teeth Intact   Pulmonary neg shortness of breath, neg sleep apnea, neg COPD, Recent URI , Residual Cough   Pulmonary exam normal breath sounds clear to auscultation       Cardiovascular Exercise Tolerance: Good hypertension, (-) angina (-) Past MI and (-) Cardiac Stents + dysrhythmias Atrial Fibrillation (-) Valvular Problems/Murmurs Rhythm:regular Rate:Normal     Neuro/Psych negative neurological ROS  negative psych ROS   GI/Hepatic Neg liver ROS,GERD  Medicated and Controlled,,  Endo/Other  negative endocrine ROS    Renal/GU negative Renal ROS  negative genitourinary   Musculoskeletal   Abdominal   Peds  Hematology negative hematology ROS (+)   Anesthesia Other Findings Past Medical History: No date: A-fib (HCC) No date: Arthritis     Comment:  right knee No date: Cancer ([place])     Comment:  right shoulder basal cell No date: GERD (gastroesophageal reflux disease) No date: Hypertension No date: Pneumonia No date: Pre-diabetes No date: Wears contact lenses   Reproductive/Obstetrics negative OB ROS                             Anesthesia Physical Anesthesia Plan  ASA: 2  Anesthesia Plan: General   Post-op Pain Management: Regional block*   Induction: Intravenous  PONV Risk Score and Plan: 2 and Ondansetron, Dexamethasone, Midazolam and Treatment may vary due to age or medical condition  Airway Management Planned: Oral ETT  Additional Equipment:    Intra-op Plan:   Post-operative Plan: Extubation in OR  Informed Consent: I have reviewed the patients History and Physical, chart, labs and discussed the procedure including the risks, benefits and alternatives for the proposed anesthesia with the patient or authorized representative who has indicated his/her understanding and acceptance.     Dental Advisory Given  Plan Discussed with: Anesthesiologist, CRNA and Surgeon  Anesthesia Plan Comments:        Anesthesia Quick Evaluation

## 2022-06-02 NOTE — Anesthesia Postprocedure Evaluation (Signed)
Anesthesia Post Note  Patient: Mason West  Procedure(s) Performed: RIGHT SHOULDER ARTHROSCOPY WITH DEBRIDEMENT, DECOMPRESSION, REPAIR OF HIS MASSIVE ROTATOR CUFF TEAR, AND POSSIBLE BICEPS TENODESIS (Right: Shoulder)  Patient location during evaluation: PACU Anesthesia Type: General Level of consciousness: awake and alert Pain management: pain level controlled Vital Signs Assessment: post-procedure vital signs reviewed and stable Respiratory status: spontaneous breathing, nonlabored ventilation, respiratory function stable and patient connected to nasal cannula oxygen Cardiovascular status: blood pressure returned to baseline and stable Postop Assessment: no apparent nausea or vomiting Anesthetic complications: no   No notable events documented.   Last Vitals:  Vitals:   06/02/22 1730 06/02/22 1745  BP: (!) 132/95 (!) 139/95  Pulse: 86 87  Resp: 14 14  Temp:    SpO2: 93% 95%    Last Pain:  Vitals:   06/02/22 1745  TempSrc:   PainSc: 0-No pain                 Arita Miss

## 2022-06-02 NOTE — Anesthesia Procedure Notes (Signed)
Procedure Name: Intubation Date/Time: 06/02/2022 2:01 PM  Performed by: Fredderick Phenix, CRNAPre-anesthesia Checklist: Patient identified, Emergency Drugs available, Suction available and Patient being monitored Patient Re-evaluated:Patient Re-evaluated prior to induction Oxygen Delivery Method: Circle system utilized Preoxygenation: Pre-oxygenation with 100% oxygen Induction Type: IV induction Ventilation: Mask ventilation without difficulty Laryngoscope Size: McGraph and 4 Grade View: Grade I Tube type: Oral Tube size: 7.5 mm Number of attempts: 1 Airway Equipment and Method: Stylet and Oral airway Placement Confirmation: ETT inserted through vocal cords under direct vision, positive ETCO2 and breath sounds checked- equal and bilateral Secured at: 21 cm Tube secured with: Tape Dental Injury: Teeth and Oropharynx as per pre-operative assessment

## 2022-06-02 NOTE — H&P (Signed)
History of Present Illness: Mason West is a 52 y.o. who presents today for a history and physical. Patient is to undergo a right shoulder arthroscopy with debridement, decompression, and repair of massive rotator cuff tear and possible biceps tenodesis. Date of surgery 06/02/2022. States the patient was last here in the clinic there is been no change in his condition. Patient expresses his desires to proceed with surgery. Preop work has been done.  The patient's symptoms began on 01/20/2022 and developed as a result of an injury. Apparently he was trying to fix a commode when he slipped on the bathtub and injured his shoulder. He tends to work out with a trainer so he told the trainer and has been doing exercises for shoulder since that time with little improvement in his symptoms. The patient does recall a prior injury to his right shoulder playing with his kids in a pool during the summer of 2021. He saw Dr. Enrigue Catena at [facility] in [place] several months later who gave him a steroid injection with subsequent relief of his symptoms. The patient describes the symptoms as moderate (patient is active but has had to make modifications or give up activities) and have the quality of being aching, nagging, stabbing, tender, throbbing, and tiring. The pain is localized to the lateral arm/shoulder. These symptoms are aggravated with normal daily activities, with sleeping, carrying heavy objects, at higher levels of activity, with overhead activity, and reaching behind the back. He has tried non-steroidal anti-inflammatories (Advil) with temporary partial relief of his symptoms. He has tried rest , ice , and a supervised home exercise program with limited benefit. He has tried the one steroid injection noted above previously with temporary relief of his symptoms with good relief. He denies any neck pain, nor does he note any numbness or paresthesias down his arm to his hand. He is right-hand dominant. 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 He notes that he can shoot with two hands but is unable to support the gun and shoot with any accuracy with just his right hand.   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 Roland Rack)   Past Family History: High blood pressure (Hypertension) Maternal Grandfather  Alzheimer's disease Maternal Grandfather  Coronary Artery Disease (Blocked arteries around heart) Maternal Grandfather  Diabetes type II Maternal Grandfather  Hyperlipidemia (Elevated cholesterol) Maternal Grandfather  Obesity Maternal Grandfather  Skin cancer Maternal Grandfather  No Known Problems Mother  Alcohol abuse Father  Alcoholic  Bipolar disorder Father  Depression Father  High blood pressure (Hypertension) Father  Hyperlipidemia (Elevated cholesterol) Paternal Grandmother  High blood pressure (Hypertension) Paternal Grandmother  Obesity Paternal Grandmother  High blood pressure (Hypertension) Maternal Uncle   Medications: amLODIPine (NORVASC) 10 MG tablet Take 1 tablet (10 mg total) by mouth once daily 90 tablet 3  ibuprofen (MOTRIN) 800 MG tablet Take 800 mg by mouth every 6 (six) hours as needed for Fever  multivitamin tablet Take 1 tablet by mouth once daily  OMEGA-3/DHA/EPA/FISH OIL (FISH OIL-OMEGA-3 FATTY ACIDS) 300-1,000 mg capsule Take 2 capsules by mouth once daily  omeprazole (PRILOSEC) 40 MG DR capsule TAKE 1 CAPSULE BY MOUTH ONCE DAILY 90 capsule 3  aspirin 81 MG  EC tablet Take 1 tablet (81 mg total) by mouth once daily 30 tablet 11   Allergies: No Known Allergies   Review of Systems: A comprehensive 14 point ROS was performed, reviewed, and the pertinent  orthopaedic findings are documented in the HPI.  Physical Exam: BP 134/80 (BP Location: Left upper arm, Patient Position: Sitting, BP Cuff Size: Large Adult)  Ht 190.5 cm ('6\' 3"'$ )  Wt (!) 129 kg (284 lb 6.4 oz)  BMI 35.55 kg/m   General: Well-developed well-nourished male seen in no acute distress.   HEENT: Atraumatic,normocephalic. Pupils are equal and reactive to light. Oropharynx is clear with moist mucosa  Lungs: Clear to auscultation bilaterally   Cardiovascular: Regular rate and rhythm. Normal S1, S2. No murmurs. No appreciable gallops or rubs. Peripheral pulses are palpable.  Abdomen: Soft, non-tender, nondistended. Bowel sounds present  Right shoulder exam: SKIN: normal SWELLING: none WARMTH: none LYMPH NODES: no adenopathy palpable CREPITUS: none TENDERNESS: Mildly tender over anterolateral shoulder ROM (active):  Forward flexion: 90 degrees Abduction: 70 degrees Internal rotation: T12 ROM (passive):  Forward flexion: 175 degrees Abduction: 165 degrees ER/IR at 90 abd: 75 degrees / 75 degrees  He has moderate pain with attempted active forward flexion and abduction, as well as at the extremes of internal rotation at 90 degrees of abduction.  STRENGTH: Forward flexion: 3/5 Abduction: 3/5 External rotation: 3+-4/5 Internal rotation: 4+/5 Pain with RC testing: Moderate pain with attempted resisted abduction and forward flexion, as well as with resisted external rotation.  STABILITY: Normal  SPECIAL TESTS: Luan Pulling' test: positive, moderate Speed's test: positive Capsulitis - pain w/ passive ER: no Crossed arm test: Mildly positive Crank: Not evaluated Anterior apprehension: Negative Posterior apprehension: Not evaluated  Neurological: The patient is alert and oriented Sensation to light touch appears to be intact and within normal limits Gross motor strength appeared to be equal to 5/5  Vascular : Peripheral pulses felt to be palpable. Capillary  refill appears to be intact and within normal limits  Imaging: X-rays taken in [facility] clinic demonstrate no evidence for fractures, lytic lesions, or significant degenerative changes. The subacromial space is markedly decreased. There is no subacromial or infra-clavicular spurring. He demonstrates a Type I-II acromion.  A recent MRI scan of the right shoulder is available for review and has been reviewed by myself. By report, the study demonstrates evidence of a chronic massive rotator cuff tear with complete tears of both the supraspinatus and infraspinatus tendons with retraction of 5 cm back to the glenoid. There also is evidence of mild fatty atrophy involving both the supraspinatus and infraspinatus muscle bellies, as well as significant atrophy of the teres minor muscle. No significant degenerative changes of the glenohumeral joint are noted. 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 Both the films report reviewed by myself and discussed with the patient.    Impression: 1. Traumatic complete tear of right rotator cuff 2. Rotator cuff tendinitis  Plan:  The treatment options were discussed at length with the patient and his wife. In addition, patient educational materials were provided regarding the diagnosis and treatment options. The patient is quite frustrated by his symptoms and function limitations, and is ready to consider more aggressive treatment options. Given his age, I feel that he is too young to undergo a reverse total shoulder arthroplasty at this time. Therefore, I have recommended a surgical procedure, specifically a right shoulder arthroscopy with debridement, decompression, repair of his massive rotator cuff tear and possible biceps  tenodesis. The procedure was discussed with the patient, as were the potential risks (including bleeding, infection, nerve and/or blood vessel injury, persistent or recurrent pain, failure of the repair, progression of arthritis,  need for further surgery, blood clots, strokes, heart attacks and/or arhythmias, pneumonia, etc.) and benefits. The patient states his understanding and wishes to proceed. All of the patient's questions and concerns were answered. He can call any time with further concerns. He will follow up post-surgery, routine.    H&P reviewed and patient re-examined. No changes.

## 2022-06-02 NOTE — Op Note (Addendum)
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  Procedure:   Limited arthroscopic debridement, arthroscopic subacromial decompression, mini-open rotator cuff repair using dermal allograft patch, and mini-open biceps tenodesis, right shoulder.  Anesthesia:   General endotracheal with interscalene block using Exparel placed preoperatively by the anesthesiologist.  Surgeon:   Pascal Lux, MD  Assistant:   Cameron Proud, PA-C  Findings:   As above.  The rotator cuff tear involve the entire supraspinatus and infraspinatus tendons, as well as the superior portion of the teres minor tendons with retraction back to the glenoid rim.  The subscapularis tendon was in satisfactory condition, as well as the biceps tendon.  The labrum also was in satisfactory condition, as were the articular surfaces of the glenoid and humerus.  Complications:   None  Fluids:   600 cc  Estimated blood loss:   20 cc  Tourniquet time:   None  Drains:   None  Closure:   Staples      Brief clinical note:   The patient is a 52 year old male who recalls injuring his shoulder 2.5 years ago while throwing his son in a pool. He reinjured his shoulder 5 months ago while working on a toilet in his bathroom. The ladder injury prompted him to seek further orthopedic care due to increased pain and weakness. The patient's symptoms have progressed despite medications, activity modification, etc. The patient's history and examination are consistent with impingement/tendinopathy with a massive rotator cuff tear which was confirmed by MRI scan. The patient presents at this time for definitive management of his shoulder symptoms.  Procedure:   The patient underwent placement of an  interscalene block using Exparel by the anesthesiologist in the preoperative holding area before being brought into the operating room and lain in the supine position. The patient then underwent general endotracheal intubation and anesthesia before being repositioned in the beach chair position using the beach chair positioner. The right shoulder and upper extremity were prepped with ChloraPrep solution before being draped sterilely. Preoperative antibiotics were administered. A timeout was performed to confirm the proper surgical site before the expected portal sites and incision site were injected with 0.5% Sensorcaine with epinephrine.   A posterior portal was created and the glenohumeral joint thoroughly inspected with the findings as described above. A lateral portal was created using an outside-in technique. The labrum and rotator cuff were further assessed, again confirming the above-noted findings. The torn margin of the rotator cuff was identified and mobilized with the ArthroCare wand before its mobility was assessed using a rotator cuff grasper. The rotator cuff margin could be mobilized laterally for approximately half of the retracted distance. Therefore, it was felt reasonable to attempt a formal repair of the rotator cuff tendon. Given the size of the tendon, it was felt best to leave the biceps tendon intact in case it could be incorporated into the repair. The instruments were removed from the joint after suctioning the excess fluid.  The camera was repositioned through the posterior portal into the subacromial space. The ArthroCare wand was inserted and used to remove the periosteal tissue off the undersurface of the anterior third of the acromion as well as to debride extraneous bursal tissues from the anterior and lateral portions of the subacromial space. The instruments were then removed  from the subacromial space after suctioning the excess fluid.  An approximately 7-8 cm incision was  made over the anterolateral aspect of the shoulder beginning at the anterolateral corner of the acromion and extending distally in line with the bicipital groove. This incision was carried down through the subcutaneous tissues to expose the deltoid fascia. The raphae between the anterior and middle thirds was identified and this plane developed to provide access into the subacromial space. Additional bursal tissues were debrided sharply using Metzenbaum scissors. The rotator cuff tear was readily identified. The margins were debrided sharply with a #15 blade and the exposed greater tuberosity roughened with a rongeur.    Multiple #2 FiberWire sutures were passed through the free margin of the rotator cuff to facilitate its mobilization additional mobilization of the superficial and deep surfaces of the rotator cuff tendon also was performed to optimize overall mobilization of the tendon. The biceps tendon was released and the midpoint in the bicipital groove and the distal portion secured into the bicipital groove utilizing a single Biomet 2.9 mm JuggerKnot anchor. Both sets of sutures were passed through the biceps tendon and tied securely to effect the tenodesis. The proximal end was secured to the anterior portion of the supraspinatus footprint at the articular margin utilizing a Smith [employer] Nephew 2.9 mm Q-Fix anchor. A second Smith [employer] Nephew 2.9 mm Q-Fix anchor was placed posteriorly and used to repair the posterior portion of the infraspinatus with the superior portion of the teres minor. In addition, several #2 FiberWire sutures were placed in a side-to-side fashion to repair the more anterior portion of the rotator cuff defect.  Despite these efforts at mobilization and supplementation, the rotator cuff could not be fully brought back to the greater tuberosity footprint. Therefore, it was elected to supplement the repair with a dermal allograft. After the graft was trimmed to the appropriate size, the  numerous traction sutures placed along the lateral margin of the rotator cuff were passed sequentially through the graft and the graft advanced medially to its appropriate position. Each of the sutures were then tied securely. More laterally, two additional Smith [employer] Nephew 2.9 mm Q-Fix anchors were placed at the articular margin. These sutures were passed through the dermal allograft and tied securely. These sutures were brought back laterally along with most of the sutures passed through the lateral margin of the native tissue and secured to the lateral portion of the greater trochanter using two Zimmer-Biomet [place] QuatroLink anchors to effect a transosseous equivalent repair. An apparent watertight closure was obtained.  The wound was copiously irrigated with sterile saline solution before the deltoid raphae was reapproximated using 2-0 Vicryl interrupted sutures. The subcutaneous tissues were closed in two layers using 2-0 Vicryl interrupted sutures before the skin was closed using staples. The portal sites also were closed using staples. A sterile bulky dressing was applied to the shoulder before a Polar Care device was applied and the arm placed into a shoulder immobilizer. The patient was then awakened, extubated, and returned to the recovery room in satisfactory condition after tolerating the procedure well.  The need to incorporate a dermal allograft into the repair significantly increased the complexity of this procedure.  Furthermore, it also added approximately an additional hour of surgical time in order to properly size, position, and secure the graft.

## 2022-06-02 NOTE — Transfer of Care (Signed)
Immediate Anesthesia Transfer of Care Note  Patient: Mason West  Procedure(s) Performed: RIGHT SHOULDER ARTHROSCOPY WITH DEBRIDEMENT, DECOMPRESSION, REPAIR OF HIS MASSIVE ROTATOR CUFF TEAR, AND POSSIBLE BICEPS TENODESIS (Right: Shoulder)  Patient Location: PACU  Anesthesia Type:General  Level of Consciousness: sedated  Airway & Oxygen Therapy: Patient Spontanous Breathing and Patient connected to face mask oxygen  Post-op Assessment: Report given to RN and Post -op Vital signs reviewed and stable  Post vital signs: Reviewed and stable  Last Vitals:  Vitals Value Taken Time  BP 132/83 06/02/22 1715  Temp 36.3 C 06/02/22 1710  Pulse 85 06/02/22 1717  Resp 13 06/02/22 1717  SpO2 95 % 06/02/22 1717  Vitals shown include unvalidated device data.  Last Pain:  Vitals:   06/02/22 1307  TempSrc: Oral  PainSc: 0-No pain         Complications: No notable events documented.

## 2022-06-03 ENCOUNTER — Encounter: Payer: Self-pay | Admitting: Surgery

## 2022-11-09 ENCOUNTER — Ambulatory Visit: Payer: 59

## 2022-11-09 DIAGNOSIS — K5730 Diverticulosis of large intestine without perforation or abscess without bleeding: Secondary | ICD-10-CM

## 2022-11-09 DIAGNOSIS — K640 First degree hemorrhoids: Secondary | ICD-10-CM

## 2022-11-09 DIAGNOSIS — Z1211 Encounter for screening for malignant neoplasm of colon: Secondary | ICD-10-CM

## 2024-06-23 ENCOUNTER — Other Ambulatory Visit: Payer: Self-pay | Admitting: Family Medicine

## 2024-06-23 DIAGNOSIS — M5416 Radiculopathy, lumbar region: Secondary | ICD-10-CM
